# Patient Record
Sex: Male | Born: 2000 | Race: White | Hispanic: No | Marital: Single | Smoking: Current every day smoker
Health system: Southern US, Community
[De-identification: ages and names within clinical notes are randomized; demographics above are authoritative.]

## PROBLEM LIST (undated history)

## (undated) DIAGNOSIS — J45909 Unspecified asthma, uncomplicated: Secondary | ICD-10-CM

## (undated) DIAGNOSIS — R569 Unspecified convulsions: Secondary | ICD-10-CM

## (undated) DIAGNOSIS — E119 Type 2 diabetes mellitus without complications: Secondary | ICD-10-CM

---
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
## 2020-04-04 ENCOUNTER — Emergency Department (HOSPITAL_COMMUNITY)
Admission: EM | Admit: 2020-04-04 | Discharge: 2020-04-04 | Discharge status: Against Medical Advice | Payer: Self-pay | Attending: Emergency Medicine | Admitting: Emergency Medicine

## 2020-04-04 ENCOUNTER — Other Ambulatory Visit: Payer: Self-pay

## 2020-04-04 NOTE — ED Triage Notes (Signed)
Not in waiting area,cannot be seen outside

## 2020-04-04 NOTE — ED Triage Notes (Signed)
Patient called for triage , outside smoking

## 2020-05-20 ENCOUNTER — Emergency Department (HOSPITAL_COMMUNITY)
Admission: EM | Admit: 2020-05-20 | Discharge: 2020-05-20 | Disposition: A | Discharge status: Home / Self Care | Payer: Medicaid - Out of State | Attending: Emergency Medicine | Admitting: Emergency Medicine

## 2020-05-20 ENCOUNTER — Encounter (HOSPITAL_COMMUNITY): Payer: Self-pay

## 2020-05-20 ENCOUNTER — Other Ambulatory Visit: Payer: Self-pay

## 2020-05-20 DIAGNOSIS — E119 Type 2 diabetes mellitus without complications: Secondary | ICD-10-CM | POA: Diagnosis not present

## 2020-05-20 DIAGNOSIS — F17210 Nicotine dependence, cigarettes, uncomplicated: Secondary | ICD-10-CM | POA: Diagnosis not present

## 2020-05-20 DIAGNOSIS — J45909 Unspecified asthma, uncomplicated: Secondary | ICD-10-CM | POA: Diagnosis not present

## 2020-05-20 DIAGNOSIS — H6122 Impacted cerumen, left ear: Secondary | ICD-10-CM | POA: Diagnosis present

## 2020-05-20 HISTORY — DX: Unspecified asthma, uncomplicated: J45.909

## 2020-05-20 HISTORY — DX: Type 2 diabetes mellitus without complications: E11.9

## 2020-05-20 HISTORY — DX: Unspecified convulsions: R56.9

## 2020-05-20 MED ORDER — CARBAMIDE PEROXIDE 6.5 % OT SOLN
10.0000 [drp] | Freq: Once | OTIC | Status: AC
  Administered 2020-05-20: 10 [drp] via OTIC
  Filled 2020-05-20: qty 15

## 2020-05-20 NOTE — ED Provider Notes (Signed)
[facility] EMERGENCY DEPARTMENT Provider Note   CSN: [id number] Arrival date & time: 05/20/20  1116     History Chief Complaint  Patient presents with  . Cerumen Impaction    Caleb Bray is a 20 y.o. male.  HPI      Caleb Bray is a 20 y.o. male with past medical history of asthma, diabetes and seizures who presents to the Emergency Department requesting cerumen removal from the left ear canal.  States he has had wax buildup of the left ear for several days.  He describes a pressure sensation to his left ear.  Tried to remove the wax himself using a Q-tip.  States he is recently moved to the area and was unsure where to go for medical care.  Does not currently have a PCP.  He denies headache, nausea, vomiting, dizziness or visual change.  No recent illness, fever or chills.  Nothing makes his symptoms better or worse.    Past Medical History:  Diagnosis Date  . Asthma   . Diabetes mellitus without complication (HCC)   . Seizures (HCC)     There are no problems to display for this patient.   History reviewed. No pertinent surgical history.     No family history on file.  Social History   Tobacco Use  . Smoking status: Current Every Day Smoker    Packs/day: 0.50    Years: 10.00    Pack years: 5.00    Types: Cigarettes  . Smokeless tobacco: Never Used  Substance Use Topics  . Alcohol use: Yes    Alcohol/week: 5.0 standard drinks    Types: 5 Cans of beer per week    Comment: occasionally  . Drug use: Not Currently    Types: Cocaine    Comment: occassional    Home Medications Prior to Admission medications   Not on File    Allergies    Patient has no allergy information on record.  Review of Systems   Review of Systems  Constitutional: Negative for activity change, appetite change, chills, fatigue and fever.  HENT: Positive for ear pain. Negative for congestion, ear discharge, sore throat and trouble swallowing.   Eyes: Negative for visual  disturbance.  Respiratory: Negative for shortness of breath and wheezing.   Cardiovascular: Negative for chest pain.  Gastrointestinal: Negative for nausea and vomiting.  Musculoskeletal: Negative for myalgias, neck pain and neck stiffness.  Skin: Negative for rash.  Neurological: Negative for dizziness, weakness, numbness and headaches.  Hematological: Does not bruise/bleed easily.    Physical Exam Updated Vital Signs BP 113/82   Pulse 70   Temp 98.8 F (37.1 C) (Oral)   Resp 16   Ht 5\' 9"  (1.753 m)   Wt 86.2 kg   SpO2 97%   BMI 28.06 kg/m   Physical Exam Vitals and nursing note reviewed.  Constitutional:      General: He is not in acute distress.    Appearance: Normal appearance.  HENT:     Head: Atraumatic.     Right Ear: External ear normal.     Left Ear: External ear normal. There is impacted cerumen.     Ears:     Comments: Cerumen to the left ear canal.  TM not visualized due to cerumen impaction.  Abrasion noted of the left ear canal.  No active bleeding.  No edema.  Cerumen also present in the right ear canal, but TM visualized and appears normal    Mouth/Throat:  Mouth: Mucous membranes are moist.  Eyes:     Conjunctiva/sclera: Conjunctivae normal.  Cardiovascular:     Rate and Rhythm: Normal rate and regular rhythm.     Pulses: Normal pulses.  Pulmonary:     Effort: Pulmonary effort is normal.     Breath sounds: Normal breath sounds.  Musculoskeletal:        General: Normal range of motion.     Cervical back: Normal range of motion.  Skin:    General: Skin is warm.  Neurological:     General: No focal deficit present.     Mental Status: He is alert.     Sensory: No sensory deficit.     Motor: No weakness.     ED Results / Procedures / Treatments   Labs (all labs ordered are listed, but only abnormal results are displayed) Labs Reviewed - No data to display  EKG None  Radiology No results found.  Procedures Procedures   Medications  Ordered in ED Medications  carbamide peroxide (DEBROX) 6.5 % OTIC (EAR) solution 10 drop (has no administration in time range)    ED Course  I have reviewed the triage vital signs and the nursing notes.  Pertinent labs & imaging results that were available during my care of the patient were reviewed by me and considered in my medical decision making (see chart for details).    MDM Rules/Calculators/A&P                          Patient here for requesting cerumen removal from the left ear canal.  No associated symptoms.  On exam, unable to visualize left TM due to cerumen impaction.  Will apply Debrox and irrigate ear canal.   Irrigation performed by nursing staff On recheck, hearing improved.  Large amount of dark cerumen irrigated from the left ear canal.  TM now visualized and appears intact.  Abrasion to ear canal with a small amount of blood present.  No active bleeding.  Patient appears appropriate for discharge home.  Advised to avoid using Q-tips in his ears.  Final Clinical Impression(s) / ED Diagnoses Final diagnoses:  Impacted cerumen of left ear    Rx / DC Orders ED Discharge Orders    None       Pauline Aus, PA-C 05/20/20 1413    Jacalyn Lefevre, MD 05/20/20 1520

## 2020-05-20 NOTE — Discharge Instructions (Addendum)
Avoid using any Q-tips or other medications to your left ear for at least 1 week.  You may use the Debrox solution 3 to 5 drops to each ear every 2 to 3 weeks as needed for earwax buildup.  I have provided follow-up information for a local provider for you to establish care with

## 2020-05-20 NOTE — ED Triage Notes (Signed)
Pt presented to ED with excessive ear wax to left ear.

## 2020-11-15 ENCOUNTER — Emergency Department (HOSPITAL_COMMUNITY)
Admission: EM | Admit: 2020-11-15 | Discharge: 2020-11-15 | Disposition: A | Discharge status: Home / Self Care | Payer: Medicaid - Out of State | Attending: Emergency Medicine | Admitting: Emergency Medicine

## 2020-11-15 ENCOUNTER — Other Ambulatory Visit: Payer: Self-pay

## 2020-11-15 DIAGNOSIS — F17210 Nicotine dependence, cigarettes, uncomplicated: Secondary | ICD-10-CM | POA: Insufficient documentation

## 2020-11-15 DIAGNOSIS — R112 Nausea with vomiting, unspecified: Secondary | ICD-10-CM

## 2020-11-15 DIAGNOSIS — Z20822 Contact with and (suspected) exposure to covid-19: Secondary | ICD-10-CM | POA: Insufficient documentation

## 2020-11-15 DIAGNOSIS — R197 Diarrhea, unspecified: Secondary | ICD-10-CM

## 2020-11-15 DIAGNOSIS — E119 Type 2 diabetes mellitus without complications: Secondary | ICD-10-CM | POA: Insufficient documentation

## 2020-11-15 DIAGNOSIS — B349 Viral infection, unspecified: Secondary | ICD-10-CM | POA: Diagnosis not present

## 2020-11-15 DIAGNOSIS — J45909 Unspecified asthma, uncomplicated: Secondary | ICD-10-CM | POA: Insufficient documentation

## 2020-11-15 LAB — RESP PANEL BY RT-PCR (FLU A&B, COVID) ARPGX2
Influenza A by PCR: NEGATIVE
Influenza B by PCR: NEGATIVE
SARS Coronavirus 2 by RT PCR: NEGATIVE

## 2020-11-15 MED ORDER — ONDANSETRON 4 MG PO TBDP: 4.0000 mg | ORAL_TABLET | Freq: Three times a day (TID) | ORAL | 0 refills | Status: DC | PRN

## 2020-11-15 NOTE — ED Triage Notes (Signed)
Pt states he is having symptoms of Covid and wants to get tested for it. States he has been having diarrhea, headaches, vomiting, and dizziness that started yesterday morning.

## 2020-11-15 NOTE — Discharge Instructions (Signed)
You were evaluated in the Emergency Department and after careful evaluation, we did not find any emergent condition requiring admission or further testing in the hospital.  Your exam/testing today is overall reassuring.  Symptoms likely due to a viral illness.  Use MyChart to follow-up on your COVID and flu test results.  Otherwise drink plenty of fluids, use the Zofran as needed for nausea.  Please return to the Emergency Department if you experience any worsening of your condition.   Thank you for allowing [place] to be a part of your care.

## 2020-11-15 NOTE — ED Provider Notes (Signed)
[facility]-EMERGENCY DEPT [facility] Emergency Department Provider Note MRN:  [id number]  Arrival date & time: 11/15/20     Chief Complaint   N/V/D   History of Present Illness   Caleb Bray is a 20 y.o. year-old male with a history of diabetes, asthma presenting to the ED with chief complaint of nausea vomiting diarrhea.  1 to 2 days of symptoms, starting with diarrhea and progressing to nausea and 3 episodes of nonbloody nonbilious emesis this evening.  Symptoms mild to moderate, feels general malaise.  Feeling lightheaded sometimes.  Denies headache or vision change, no neck or back pain, no chest pain or shortness of breath, no abdominal pain, no numbness or weakness to the arms or legs.  Wants to be tested for COVID.   Review of Systems  A complete 10 system review of systems was obtained and all systems are negative except as noted in the HPI and PMH.   Patient's Health History    Past Medical History:  Diagnosis Date   Asthma    Diabetes mellitus without complication (HCC)    Seizures (HCC)     No past surgical history on file.  No family history on file.  Social History   Socioeconomic History   Marital status: Single    Spouse name: Not on file   Number of children: Not on file   Years of education: Not on file   Highest education level: Not on file  Occupational History   Not on file  Tobacco Use   Smoking status: Every Day    Packs/day: 0.50    Years: 10.00    Pack years: 5.00    Types: Cigarettes   Smokeless tobacco: Never  Substance and Sexual Activity   Alcohol use: Yes    Alcohol/week: 5.0 standard drinks    Types: 5 Cans of beer per week    Comment: occasionally   Drug use: Not Currently    Types: Cocaine    Comment: occassional   Sexual activity: Yes  Other Topics Concern   Not on file  Social History Narrative   Not on file   Social Determinants of Health   Financial Resource Strain: Not on file  Food Insecurity: Not on file   Transportation Needs: Not on file  Physical Activity: Not on file  Stress: Not on file  Social Connections: Not on file  Intimate Partner Violence: Not on file     Physical Exam   Vitals:   11/15/20 0510  BP: 129/71  Pulse: 72  Resp: 18  Temp: 97.7 F (36.5 C)  SpO2: 97%    CONSTITUTIONAL: Well-appearing, NAD NEURO:  Alert and oriented x 3, no focal deficits EYES:  eyes equal and reactive ENT/NECK:  no LAD, no JVD CARDIO: Regular rate, well-perfused, normal S1 and S2 PULM:  CTAB no wheezing or rhonchi GI/GU:  normal bowel sounds, non-distended, non-tender MSK/SPINE:  No gross deformities, no edema SKIN:  no rash, atraumatic PSYCH:  Appropriate speech and behavior  *Additional and/or pertinent findings included in MDM below  Diagnostic and Interventional Summary    EKG Interpretation  Date/Time:    Ventricular Rate:    PR Interval:    QRS Duration:   QT Interval:    QTC Calculation:   R Axis:     Text Interpretation:         Labs Reviewed  RESP PANEL BY RT-PCR (FLU A&B, COVID) [id number]    No orders to display    Medications -  No data to display   Procedures  /  Critical Care Procedures  ED Course and Medical Decision Making  I have reviewed the triage vital signs, the nursing notes, and pertinent available records from the EMR.  Listed above are laboratory and imaging tests that I personally ordered, reviewed, and interpreted and then considered in my medical decision making (see below for details).  Suspect viral illness, patient is with normal vital signs, well-appearing, completely benign abdominal exam with no tenderness, no rebound guarding or rigidity.  Doubt emergent process, appropriate for discharge with reassurance.       Elmer Sow. Pilar Plate, MD [facility] Emergency Medicine [facility] [email or web address]  Final Clinical Impressions(s) / ED Diagnoses     ICD-10-CM   1. Viral illness  B34.9     2. Nausea vomiting and  diarrhea  R11.2    R19.7       ED Discharge Orders          Ordered    ondansetron (ZOFRAN ODT) 4 MG disintegrating tablet  Every 8 hours PRN        11/15/20 0520             Discharge Instructions Discussed with and Provided to Patient:    Discharge Instructions      You were evaluated in the Emergency Department and after careful evaluation, we did not find any emergent condition requiring admission or further testing in the hospital.  Your exam/testing today is overall reassuring.  Symptoms likely due to a viral illness.  Use MyChart to follow-up on your COVID and flu test results.  Otherwise drink plenty of fluids, use the Zofran as needed for nausea.  Please return to the Emergency Department if you experience any worsening of your condition.   Thank you for allowing [place] to be a part of your care.       Sabas Sous, MD 11/15/20 [phone]

## 2021-03-06 ENCOUNTER — Encounter (HOSPITAL_COMMUNITY): Payer: Self-pay

## 2021-03-06 ENCOUNTER — Other Ambulatory Visit: Payer: Self-pay

## 2021-03-06 ENCOUNTER — Emergency Department (HOSPITAL_COMMUNITY): Payer: Medicaid - Out of State

## 2021-03-06 ENCOUNTER — Emergency Department (HOSPITAL_COMMUNITY)
Admission: EM | Admit: 2021-03-06 | Discharge: 2021-03-06 | Disposition: A | Discharge status: Home / Self Care | Payer: Medicaid - Out of State | Attending: Emergency Medicine | Admitting: Emergency Medicine

## 2021-03-06 DIAGNOSIS — S40011A Contusion of right shoulder, initial encounter: Secondary | ICD-10-CM | POA: Diagnosis not present

## 2021-03-06 DIAGNOSIS — Y92410 Unspecified street and highway as the place of occurrence of the external cause: Secondary | ICD-10-CM | POA: Insufficient documentation

## 2021-03-06 DIAGNOSIS — S161XXA Strain of muscle, fascia and tendon at neck level, initial encounter: Secondary | ICD-10-CM | POA: Diagnosis not present

## 2021-03-06 DIAGNOSIS — S0083XA Contusion of other part of head, initial encounter: Secondary | ICD-10-CM | POA: Diagnosis not present

## 2021-03-06 DIAGNOSIS — S20211A Contusion of right front wall of thorax, initial encounter: Secondary | ICD-10-CM | POA: Diagnosis not present

## 2021-03-06 DIAGNOSIS — S0093XA Contusion of unspecified part of head, initial encounter: Secondary | ICD-10-CM

## 2021-03-06 DIAGNOSIS — S199XXA Unspecified injury of neck, initial encounter: Secondary | ICD-10-CM | POA: Diagnosis present

## 2021-03-06 MED ORDER — IBUPROFEN 800 MG PO TABS
800.0000 mg | ORAL_TABLET | Freq: Once | ORAL | Status: AC
  Administered 2021-03-06: 800 mg via ORAL
  Filled 2021-03-06: qty 1

## 2021-03-06 MED ORDER — IBUPROFEN 800 MG PO TABS: 800.0000 mg | ORAL_TABLET | Freq: Three times a day (TID) | ORAL | 0 refills | Status: DC | PRN
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
## 2021-03-06 NOTE — ED Notes (Signed)
Patient transported to X-ray 

## 2021-03-06 NOTE — ED Notes (Signed)
Pt returned from xray

## 2021-03-06 NOTE — ED Notes (Signed)
C collar placed at this time.

## 2021-03-06 NOTE — Discharge Instructions (Addendum)
Return if any problems.

## 2021-03-06 NOTE — ED Notes (Signed)
Pt returned from CT °

## 2021-03-06 NOTE — ED Notes (Signed)
Patient transported to CT 
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
## 2021-05-27 ENCOUNTER — Encounter (HOSPITAL_COMMUNITY): Payer: Self-pay

## 2021-05-27 ENCOUNTER — Other Ambulatory Visit: Payer: Self-pay

## 2021-05-27 ENCOUNTER — Emergency Department (HOSPITAL_COMMUNITY)
Admission: EM | Admit: 2021-05-27 | Discharge: 2021-05-27 | Disposition: A | Discharge status: Home / Self Care | Payer: Medicaid - Out of State | Attending: Emergency Medicine | Admitting: Emergency Medicine

## 2021-05-27 DIAGNOSIS — K122 Cellulitis and abscess of mouth: Secondary | ICD-10-CM | POA: Insufficient documentation

## 2021-05-27 DIAGNOSIS — J029 Acute pharyngitis, unspecified: Secondary | ICD-10-CM | POA: Diagnosis present

## 2021-05-27 LAB — GROUP A STREP BY PCR: Group A Strep by PCR: NOT DETECTED

## 2021-05-27 MED ORDER — DEXAMETHASONE SODIUM PHOSPHATE 10 MG/ML IJ SOLN
8.0000 mg | Freq: Once | INTRAMUSCULAR | Status: AC
  Administered 2021-05-27: 8 mg via INTRAMUSCULAR
  Filled 2021-05-27: qty 1

## 2021-05-27 MED ORDER — IBUPROFEN 400 MG PO TABS
600.0000 mg | ORAL_TABLET | Freq: Once | ORAL | Status: AC
  Administered 2021-05-27: 600 mg via ORAL
  Filled 2021-05-27: qty 2

## 2021-05-27 NOTE — Discharge Instructions (Addendum)
We gave you some steroids in the ER and recommended ibuprofen for your condition. ?

## 2021-05-27 NOTE — ED Triage Notes (Signed)
Pt presents to ED with complaints of sore throat and swelling since last night, denies fever. Pt denies SOB ?

## 2021-05-27 NOTE — ED Provider Notes (Signed)
?[facility] EMERGENCY DEPARTMENT ?Provider Note ? ? ?CSN: [id number] ?Arrival date & time: 05/27/21  0740 ? ?  ? ?History ? ?Chief Complaint  ?Patient presents with  ? Sore Throat  ? ? ?Caleb Bray is a 21 y.o. male present emergency department with sore throat.  The patient reports onset of symptoms approximately yesterday morning, woke up with this.  He was concerned he may be having allergic reaction as he was started on new medications 5 days ago with citalopram and trazodone, but reports he has not had any reactions in the past 4 days.  He also reports pain with swallowing.  He denies fevers or chills. ? ?HPI ? ?  ? ?Home Medications ?Prior to Admission medications   ?Medication Sig Start Date End Date Taking? Authorizing Provider  ?citalopram (CELEXA) 20 MG tablet Take 20 mg by mouth daily. 04/11/20   [provider]  ?ibuprofen (ADVIL) 800 MG tablet Take 1 tablet (800 mg total) by mouth every 8 (eight) hours as needed. 03/06/21   Elson Areas, PA-C  ?ondansetron (ZOFRAN ODT) 4 MG disintegrating tablet Take 1 tablet (4 mg total) by mouth every 8 (eight) hours as needed for nausea or vomiting. 11/15/20   Sabas Sous, MD  ?risperiDONE (RISPERDAL) 1 MG tablet Take 1 mg by mouth at bedtime. 04/11/20   [provider]  ?traZODone (DESYREL) 100 MG tablet Take 100 mg by mouth at bedtime. 04/11/20   [provider]  ?   ? ?Allergies    ?Trazodone and nefazodone   ? ?Review of Systems   ?Review of Systems ? ?Physical Exam ?Updated Vital Signs ?BP 121/90 (BP Location: Left Arm)   Pulse 66   Temp 97.9 ?F (36.6 ?C) (Oral)   Resp 18   Ht 5\' 9"  (1.753 m)   Wt 99.7 kg   SpO2 99%   BMI 32.47 kg/m?  ?Physical Exam ?Constitutional:   ?   General: He is not in acute distress. ?HENT:  ?   Head: Normocephalic and atraumatic.  ?   Mouth/Throat:  ?   Pharynx: Uvula midline. Uvula swelling present.  ?   Tonsils: No tonsillar exudate or tonsillar abscesses.  ?Eyes:  ?   Conjunctiva/sclera:  Conjunctivae normal.  ?   Pupils: Pupils are equal, round, and reactive to light.  ?Cardiovascular:  ?   Rate and Rhythm: Normal rate and regular rhythm.  ?Pulmonary:  ?   Effort: Pulmonary effort is normal. No respiratory distress.  ?Abdominal:  ?   General: There is no distension.  ?   Tenderness: There is no abdominal tenderness.  ?Skin: ?   General: Skin is warm and dry.  ?Neurological:  ?   General: No focal deficit present.  ?   Mental Status: He is alert. Mental status is at baseline.  ?Psychiatric:     ?   Mood and Affect: Mood normal.     ?   Behavior: Behavior normal.  ? ? ?ED Results / Procedures / Treatments   ?Labs ?(all labs ordered are listed, but only abnormal results are displayed) ?Labs Reviewed  ?GROUP A STREP BY PCR  ? ? ?EKG ?None ? ?Radiology ?No results found. ? ?Procedures ?Procedures  ? ? ?Medications Ordered in ED ?Medications  ?ibuprofen (ADVIL) tablet 600 mg (600 mg Oral Given 05/27/21 0953)  ?dexamethasone (DECADRON) injection 8 mg (8 mg Intramuscular Given 05/27/21 0953)  ? ? ?ED Course/ Medical Decision Making/ A&P ?  ?                        ?  Medical Decision Making ?Risk ?Prescription drug management. ? ? ?Patient is here with sore throat that began this morning.  Clinical exam is consistent with uvulitis.  I do not see evidence of peritonsillar abscess, doubt Ludwig's angina or deep space infection.  I do not believe need emergent CT scan of the neck or soft tissues at this time.  I suspect is likely related to a viral illness.  We can treat with anti-inflammatories with intramuscular Decadron as well as ibuprofen at home.  I have very low suspicion for acute allergic reaction given that he has no other systemic symptoms or signs, and that he has been taking these new medications for 4 days with no adverse reaction.  I believe he is reasonably safe and stable for discharge after medications ? ? ? ? ? ? ? ?Final Clinical Impression(s) / ED Diagnoses ?Final diagnoses:  ?Uvulitis   ? ? ?Rx / DC Orders ?ED Discharge Orders   ? ? None  ? ?  ? ? ?  ?Terald Sleeper, MD ?05/27/21 [phone] ? ?

## 2021-06-10 ENCOUNTER — Emergency Department (HOSPITAL_COMMUNITY)
Admission: EM | Admit: 2021-06-10 | Discharge: 2021-06-10 | Discharge status: Against Medical Advice | Payer: Medicaid - Out of State | Attending: Emergency Medicine | Admitting: Emergency Medicine

## 2021-06-10 ENCOUNTER — Encounter (HOSPITAL_COMMUNITY): Payer: Self-pay | Admitting: *Deleted

## 2021-06-10 ENCOUNTER — Other Ambulatory Visit: Payer: Self-pay

## 2021-06-10 DIAGNOSIS — Z5321 Procedure and treatment not carried out due to patient leaving prior to being seen by health care provider: Secondary | ICD-10-CM | POA: Insufficient documentation

## 2021-06-10 DIAGNOSIS — R509 Fever, unspecified: Secondary | ICD-10-CM | POA: Diagnosis present

## 2021-06-10 NOTE — ED Triage Notes (Signed)
Pt c/o fever with sweating and state he "blacked out" an hour ago

## 2021-06-10 NOTE — ED Notes (Signed)
RN attempted to call pt with no reply

## 2021-06-16 ENCOUNTER — Other Ambulatory Visit: Payer: Self-pay

## 2021-06-16 ENCOUNTER — Emergency Department (HOSPITAL_COMMUNITY)
Admission: EM | Admit: 2021-06-16 | Discharge: 2021-06-16 | Disposition: A | Discharge status: Home / Self Care | Payer: Medicaid - Out of State | Attending: Emergency Medicine | Admitting: Emergency Medicine

## 2021-06-16 ENCOUNTER — Encounter (HOSPITAL_COMMUNITY): Payer: Self-pay

## 2021-06-16 DIAGNOSIS — H6123 Impacted cerumen, bilateral: Secondary | ICD-10-CM | POA: Diagnosis not present

## 2021-06-16 DIAGNOSIS — R42 Dizziness and giddiness: Secondary | ICD-10-CM | POA: Insufficient documentation

## 2021-06-16 DIAGNOSIS — H9201 Otalgia, right ear: Secondary | ICD-10-CM | POA: Diagnosis present

## 2021-06-16 MED ORDER — CARBAMIDE PEROXIDE 6.5 % OT SOLN: 10.0000 [drp] | Freq: Once | OTIC | Status: DC

## 2021-06-16 MED ORDER — DOCUSATE SODIUM 50 MG/5ML PO LIQD
0.5000 mL | Freq: Once | ORAL | Status: AC
  Administered 2021-06-16: 5 mg via OTIC
  Filled 2021-06-16: qty 10

## 2021-06-16 NOTE — Discharge Instructions (Signed)
Both ears have been irrigated for wax buildup.  Your right ear canal is now cleared.  You may apply 2 to 3 drops of hydrogen peroxide every couple of weeks to help prevent wax buildup.  Return to the emergency department for any new or worsening symptoms.

## 2021-06-16 NOTE — ED Notes (Signed)
Bilateral ear irrigation completed with cerumen plug removed. TM red bilat. No blood noted in ear canal.

## 2021-06-16 NOTE — ED Triage Notes (Signed)
Patient with complaints of right ear pain that started the previous day.

## 2021-06-16 NOTE — ED Provider Notes (Signed)
[facility] EMERGENCY DEPARTMENT Provider Note   CSN: [id number] Arrival date & time: 06/16/21  3220     History  Chief Complaint  Patient presents with   Otalgia    Caleb Bray is a 21 y.o. male.   Otalgia Associated symptoms: hearing loss   Associated symptoms: no abdominal pain, no congestion, no ear discharge, no fever, no headaches, no sore throat and no vomiting         Caleb Bray is a 21 y.o. male who presents to the Emergency Department complaining of decreased hearing of both ears and pain of the right ear.  He notes decreased hearing for several days and pain to his right ear beginning yesterday.  He states that he has recurrent problems with wax buildup to his ears and has required irrigation multiple times.  He is requesting to have his ears flushed.  He also notes having some mild dizziness since yesterday.  He denies any fever, sore throat, chills, cough nausea or vomiting.  No headache or visual changes.   Home Medications Prior to Admission medications   Medication Sig Start Date End Date Taking? Authorizing Provider  citalopram (CELEXA) 20 MG tablet Take 20 mg by mouth daily. 04/11/20   [provider]  ibuprofen (ADVIL) 800 MG tablet Take 1 tablet (800 mg total) by mouth every 8 (eight) hours as needed. 03/06/21   Elson Areas, PA-C  ondansetron (ZOFRAN ODT) 4 MG disintegrating tablet Take 1 tablet (4 mg total) by mouth every 8 (eight) hours as needed for nausea or vomiting. 11/15/20   Sabas Sous, MD  risperiDONE (RISPERDAL) 1 MG tablet Take 1 mg by mouth at bedtime. 04/11/20   [provider]  traZODone (DESYREL) 100 MG tablet Take 100 mg by mouth at bedtime. 04/11/20   [provider]      Allergies    Trazodone and nefazodone    Review of Systems   Review of Systems  Constitutional:  Negative for chills and fever.  HENT:  Positive for ear pain and hearing loss. Negative for congestion, ear discharge, facial swelling  and sore throat.   Respiratory:  Negative for shortness of breath.   Cardiovascular:  Negative for chest pain.  Gastrointestinal:  Negative for abdominal pain, nausea and vomiting.  Neurological:  Positive for dizziness. Negative for syncope and headaches.   Physical Exam Updated Vital Signs BP (!) 133/97 (BP Location: Right Arm)   Pulse 70   Temp 98 F (36.7 C) (Oral)   Resp 20   Ht 5\' 9"  (1.753 m)   Wt 99.8 kg   SpO2 98%   BMI 32.49 kg/m  Physical Exam Vitals and nursing note reviewed.  Constitutional:      Appearance: Normal appearance. He is not ill-appearing or toxic-appearing.  HENT:     Head: Atraumatic.     Ears:     Comments: Cerumen noted to bilateral ear canals.  Unable to visualize TMs.  No drainage or edema.  External ear nontender.  No mastoid tenderness.    Nose: Nose normal.     Mouth/Throat:     Mouth: Mucous membranes are moist.     Pharynx: Oropharynx is clear.  Cardiovascular:     Rate and Rhythm: Normal rate and regular rhythm.     Pulses: Normal pulses.  Pulmonary:     Effort: Pulmonary effort is normal.  Musculoskeletal:        General: Normal range of motion.  Cervical back: Normal range of motion.  Lymphadenopathy:     Cervical: No cervical adenopathy.  Skin:    General: Skin is warm.  Neurological:     General: No focal deficit present.     Mental Status: He is alert.     Sensory: No sensory deficit.     Motor: No weakness.    ED Results / Procedures / Treatments   Labs (all labs ordered are listed, but only abnormal results are displayed) Labs Reviewed - No data to display  EKG None  Radiology No results found.  Procedures Procedures    Medications Ordered in ED Medications  docusate (COLACE) 50 MG/5ML liquid 5 mg (has no administration in time range)    ED Course/ Medical Decision Making/ A&P                           Medical Decision Making Patient here for evaluation of right ear pain, decreased hearing and  recurrent cerumen impaction.  He notes decreased hearing for several days and pain of his right ear since yesterday.  He had also notes having some mild dizziness associated with movement.  No fever or chills.  No neck pain, mastoid tenderness or headache.  On exam, patient well-appearing.  Vital signs reassuring.  He has cerumen impaction bilaterally.  There is no discharge from the ear canal.  I cannot visualize TMs at this time.  Plan will include cerumen irrigation  Risk OTC drugs.   On recheck, docusate applied in both ears irrigated with saline.  Right ear now cleared of cerumen and TM visualized without evidence of injury.  Cerumen still remains left ear canal, offered repeat irrigation but patient declines stating that his left ear feels fine and he can hear without difficulty now.  Appears stable for discharge he will follow-up with PCP        Final Clinical Impression(s) / ED Diagnoses Final diagnoses:  Bilateral impacted cerumen    Rx / DC Orders ED Discharge Orders     None         Rosey Bath 06/18/21 7680    Gerhard Munch, MD 06/18/21 1755

## 2021-06-16 NOTE — ED Notes (Signed)
Pt states he is walking home. Pt instructed not to drive or operate heavy machinery within one hour after ear irrigation.   Pt provided discharge instructions and prescription information. Pt was given the opportunity to ask questions and questions were answered.

## 2021-10-12 ENCOUNTER — Emergency Department (HOSPITAL_COMMUNITY)
Admission: EM | Admit: 2021-10-12 | Discharge: 2021-10-12 | Disposition: A | Discharge status: Home / Self Care | Payer: Medicaid - Out of State | Attending: Emergency Medicine | Admitting: Emergency Medicine

## 2021-10-12 ENCOUNTER — Encounter (HOSPITAL_COMMUNITY): Payer: Self-pay | Admitting: Emergency Medicine

## 2021-10-12 ENCOUNTER — Other Ambulatory Visit: Payer: Self-pay

## 2021-10-12 DIAGNOSIS — H6502 Acute serous otitis media, left ear: Secondary | ICD-10-CM | POA: Insufficient documentation

## 2021-10-12 DIAGNOSIS — H9202 Otalgia, left ear: Secondary | ICD-10-CM | POA: Diagnosis present

## 2021-10-12 MED ORDER — CLARITIN-D 12 HOUR 5-120 MG PO TB12: 1.0000 | ORAL_TABLET | Freq: Two times a day (BID) | ORAL | 0 refills | Status: AC

## 2021-10-12 MED ORDER — AMOXICILLIN 500 MG PO CAPS: 500.0000 mg | ORAL_CAPSULE | Freq: Three times a day (TID) | ORAL | 0 refills | Status: AC

## 2021-10-12 NOTE — Discharge Instructions (Signed)
You are being treated for a suspected early left ear infection.  Take the entire course of the antibiotics.  Also recommend trying the Claritin-D to help you with the nasal and sinus congestion which I think is playing a big role in your ear pain.

## 2021-10-12 NOTE — ED Triage Notes (Signed)
Pt to the ED with complaints of wax build up in his ears that is impeding his hearing.

## 2021-10-17 NOTE — ED Provider Notes (Signed)
[facility] EMERGENCY DEPARTMENT Provider Note   CSN: [id number] Arrival date & time: 10/12/21  2353     History  Chief Complaint  Patient presents with   Cerumen Impaction    Caleb Bray is a 21 y.o. male presenting for evaluation of decreased hearing acuity and occasional fluid sensation in his ears.  He reports having recent cerumen impactions with similar symptoms and is concerned this may have come back.  He also endorses nasal congestion without rhinorrhea, denies sinus pain, fevers or chills.  There is been no drainage from his ears and he denies any significant ear pain.  He has had no treatment prior to arrival.  The history is provided by the patient.       Home Medications Prior to Admission medications   Medication Sig Start Date End Date Taking? Authorizing Provider  amoxicillin (AMOXIL) 500 MG capsule Take 1 capsule (500 mg total) by mouth 3 (three) times daily. 10/12/21  Yes Korin Setzler, Almyra Free, PA-C  loratadine-pseudoephedrine (CLARITIN-D 12 HOUR) 5-120 MG tablet Take 1 tablet by mouth 2 (two) times daily. 10/12/21  Yes Alyas Creary, Almyra Free, PA-C  citalopram (CELEXA) 20 MG tablet Take 20 mg by mouth daily. 04/11/20   [provider]  ibuprofen (ADVIL) 800 MG tablet Take 1 tablet (800 mg total) by mouth every 8 (eight) hours as needed. 03/06/21   Fransico Meadow, PA-C  risperiDONE (RISPERDAL) 1 MG tablet Take 1 mg by mouth daily. 04/11/20   [provider]  traZODone (DESYREL) 100 MG tablet Take 100 mg by mouth at bedtime. 04/11/20   [provider]      Allergies    Trazodone and nefazodone    Review of Systems   Review of Systems  Constitutional:  Negative for chills and fever.  HENT:  Positive for congestion and hearing loss. Negative for ear pain, rhinorrhea, sinus pressure, sore throat, trouble swallowing and voice change.   Eyes:  Negative for discharge.  Respiratory:  Negative for cough, shortness of breath, wheezing and stridor.   Cardiovascular:   Negative for chest pain.  Genitourinary: Negative.     Physical Exam Updated Vital Signs BP (!) 151/93 (BP Location: Right Arm)   Pulse 74   Temp 98.4 F (36.9 C) (Oral)   Resp 16   Ht 5\' 9"  (1.753 m)   Wt 99.8 kg   SpO2 99%   BMI 32.49 kg/m  Physical Exam Constitutional:      Appearance: He is well-developed.  HENT:     Head: Normocephalic and atraumatic.     Right Ear: Tympanic membrane, ear canal and external ear normal. There is no impacted cerumen.     Left Ear: Ear canal and external ear normal. A middle ear effusion is present. There is no impacted cerumen. Tympanic membrane is bulging.     Nose: Mucosal edema present. No rhinorrhea.     Mouth/Throat:     Mouth: Mucous membranes are moist.     Pharynx: Oropharynx is clear. Uvula midline. No oropharyngeal exudate or posterior oropharyngeal erythema.     Tonsils: No tonsillar abscesses.  Eyes:     Conjunctiva/sclera: Conjunctivae normal.  Cardiovascular:     Rate and Rhythm: Normal rate.     Heart sounds: Normal heart sounds.  Pulmonary:     Effort: Pulmonary effort is normal. No respiratory distress.     Breath sounds: No wheezing or rales.  Musculoskeletal:        General: Normal range of motion.  Skin:  General: Skin is warm and dry.     Findings: No rash.  Neurological:     Mental Status: He is alert and oriented to person, place, and time.     ED Results / Procedures / Treatments   Labs (all labs ordered are listed, but only abnormal results are displayed) Labs Reviewed - No data to display  EKG None  Radiology No results found.  Procedures Procedures    Medications Ordered in ED Medications - No data to display  ED Course/ Medical Decision Making/ A&P                           Medical Decision Making Patient presenting with decreased hearing acuity in association with nasal/sinus congestion, no fevers or chills, no drainage.  His exam suggest a possible early left acute otitis media  with loss of TM landmarks and a trace of effusion present.  He has no cerumen impaction.  He is placed on antibiotics, we discussed other home treatment, specifically recommended an antihistamine decongestant combination.  As needed follow-up anticipated.  Risk OTC drugs. Prescription drug management.           Final Clinical Impression(s) / ED Diagnoses Final diagnoses:  Non-recurrent acute serous otitis media of left ear    Rx / DC Orders ED Discharge Orders          Ordered    amoxicillin (AMOXIL) 500 MG capsule  3 times daily        10/12/21 1251    loratadine-pseudoephedrine (CLARITIN-D 12 HOUR) 5-120 MG tablet  2 times daily        10/12/21 1251              Burgess Amor, Cordelia Poche 10/17/21 2131    Bethann Berkshire, MD 10/19/21 1037

## 2022-03-29 ENCOUNTER — Encounter (HOSPITAL_COMMUNITY): Payer: Self-pay

## 2022-03-29 ENCOUNTER — Emergency Department (HOSPITAL_COMMUNITY): Payer: Medicaid Other

## 2022-03-29 ENCOUNTER — Emergency Department (HOSPITAL_COMMUNITY)
Admission: EM | Admit: 2022-03-29 | Discharge: 2022-03-29 | Disposition: A | Discharge status: Home / Self Care | Payer: Medicaid Other | Attending: Emergency Medicine | Admitting: Emergency Medicine

## 2022-03-29 ENCOUNTER — Other Ambulatory Visit: Payer: Self-pay

## 2022-03-29 DIAGNOSIS — R197 Diarrhea, unspecified: Secondary | ICD-10-CM | POA: Diagnosis not present

## 2022-03-29 DIAGNOSIS — R1031 Right lower quadrant pain: Secondary | ICD-10-CM | POA: Insufficient documentation

## 2022-03-29 DIAGNOSIS — R110 Nausea: Secondary | ICD-10-CM | POA: Diagnosis not present

## 2022-03-29 LAB — COMPREHENSIVE METABOLIC PANEL
ALT: 29 U/L (ref 0–44)
AST: 23 U/L (ref 15–41)
Albumin: 4.3 g/dL (ref 3.5–5.0)
Alkaline Phosphatase: 59 U/L (ref 38–126)
Anion gap: 8 (ref 5–15)
BUN: 6 mg/dL (ref 6–20)
CO2: 25 mmol/L (ref 22–32)
Calcium: 8.8 mg/dL — ABNORMAL LOW (ref 8.9–10.3)
Chloride: 104 mmol/L (ref 98–111)
Creatinine, Ser: 0.8 mg/dL (ref 0.61–1.24)
GFR, Estimated: >60 mL/min (ref >60)
Glucose, Bld: 93 mg/dL (ref 70–99)
Potassium: 3.7 mmol/L (ref 3.5–5.1)
Sodium: 137 mmol/L (ref 135–145)
Total Bilirubin: 0.6 mg/dL (ref 0.3–1.2)
Total Protein: 7.1 g/dL (ref 6.5–8.1)

## 2022-03-29 LAB — URINALYSIS, ROUTINE W REFLEX MICROSCOPIC
Bilirubin Urine: NEGATIVE
Glucose, UA: NEGATIVE mg/dL
Hgb urine dipstick: NEGATIVE
Ketones, ur: NEGATIVE mg/dL
Leukocytes,Ua: NEGATIVE
Nitrite: NEGATIVE
Protein, ur: NEGATIVE mg/dL
Specific Gravity, Urine: >1.046 — ABNORMAL HIGH (ref 1.005–1.030)
pH: 6 (ref 5.0–8.0)

## 2022-03-29 LAB — CBC
HCT: 44.2 % (ref 39.0–52.0)
Hemoglobin: 15.1 g/dL (ref 13.0–17.0)
MCH: 30.4 pg (ref 26.0–34.0)
MCHC: 34.2 g/dL (ref 30.0–36.0)
MCV: 88.9 fL (ref 80.0–100.0)
Platelets: 238 10*3/uL (ref 150–400)
RBC: 4.97 MIL/uL (ref 4.22–5.81)
RDW: 12.5 % (ref 11.5–15.5)
WBC: 8.9 10*3/uL (ref 4.0–10.5)
nRBC: 0 % (ref 0.0–0.2)

## 2022-03-29 LAB — LIPASE, BLOOD: Lipase: 27 U/L (ref 11–51)

## 2022-03-29 MED ORDER — ONDANSETRON HCL 4 MG PO TABS: 4.0000 mg | ORAL_TABLET | Freq: Four times a day (QID) | ORAL | 0 refills | Status: AC

## 2022-03-29 MED ORDER — IOHEXOL 300 MG/ML  SOLN
100.0000 mL | Freq: Once | INTRAMUSCULAR | Status: AC | PRN
  Administered 2022-03-29: 100 mL via INTRAVENOUS

## 2022-03-29 MED ORDER — PANTOPRAZOLE SODIUM 40 MG PO TBEC: 40.0000 mg | DELAYED_RELEASE_TABLET | Freq: Every day | ORAL | 0 refills | Status: AC

## 2022-03-29 NOTE — ED Provider Notes (Signed)
[facility] Provider Note   CSN: [id number] Arrival date & time: 03/29/22  [id number]     History  Chief Complaint  Patient presents with   Abdominal Pain    Caleb Bray is a 22 y.o. male.   Abdominal Pain Associated symptoms: diarrhea and nausea   Associated symptoms: no chest pain, no chills, no cough, no dysuria, no fever, no hematuria, no shortness of breath and no vomiting        Caleb Bray is a 22 y.o. male who presents to the Emergency Department complaining of lower abdominal pain and diarrhea that began yesterday.  States pain is constant and gradually worsening.  He is having pain of the right lower quadrant.  Describes diarrhea as loose stools that are brown in color.  Some associated nausea as well.  No melena or hematochezia.  Denies any recent illness, fever, chills, vomiting, flank or back pain.  No dysuria.  Home Medications Prior to Admission medications   Medication Sig Start Date End Date Taking? Authorizing Provider  amoxicillin (AMOXIL) 500 MG capsule Take 1 capsule (500 mg total) by mouth 3 (three) times daily. 10/12/21   Evalee Jefferson, PA-C  citalopram (CELEXA) 20 MG tablet Take 20 mg by mouth daily. 04/11/20   [provider]  ibuprofen (ADVIL) 800 MG tablet Take 1 tablet (800 mg total) by mouth every 8 (eight) hours as needed. 03/06/21   Fransico Meadow, PA-C  loratadine-pseudoephedrine (CLARITIN-D 12 HOUR) 5-120 MG tablet Take 1 tablet by mouth 2 (two) times daily. 10/12/21   Evalee Jefferson, PA-C  risperiDONE (RISPERDAL) 1 MG tablet Take 1 mg by mouth daily. 04/11/20   [provider]  traZODone (DESYREL) 100 MG tablet Take 100 mg by mouth at bedtime. 04/11/20   [provider]      Allergies    Trazodone and nefazodone    Review of Systems   Review of Systems  Constitutional:  Positive for appetite change. Negative for chills and fever.  Respiratory:  Negative for cough and shortness of  breath.   Cardiovascular:  Negative for chest pain.  Gastrointestinal:  Positive for abdominal pain, diarrhea and nausea. Negative for vomiting.  Genitourinary:  Negative for dysuria, flank pain and hematuria.  Musculoskeletal:  Negative for back pain.  Neurological:  Negative for dizziness, weakness and headaches.    Physical Exam Updated Vital Signs BP 135/68   Pulse 72   Temp 98.5 F (36.9 C) (Oral)   Resp (!) 24   Ht 5\' 9"  (1.753 m)   Wt 83.9 kg   SpO2 98%   BMI 27.32 kg/m  Physical Exam Vitals and nursing note reviewed.  Constitutional:      General: He is not in acute distress.    Appearance: Normal appearance. He is well-developed. He is not ill-appearing or toxic-appearing.  HENT:     Mouth/Throat:     Mouth: Mucous membranes are moist.  Cardiovascular:     Rate and Rhythm: Normal rate and regular rhythm.     Pulses: Normal pulses.  Pulmonary:     Effort: Pulmonary effort is normal.  Abdominal:     Palpations: Abdomen is soft.     Tenderness: There is abdominal tenderness.     Comments: Tenderness palpation right lower quadrant and mid lower abdomen.  No guarding or rebound.  Abdomen is soft.  No CVA tenderness  Musculoskeletal:        General: Normal range of motion.  Skin:    General: Skin is warm.     Capillary Refill: Capillary refill takes less than 2 seconds.  Neurological:     Mental Status: He is alert.     Sensory: No sensory deficit.     Motor: No weakness.     ED Results / Procedures / Treatments   Labs (all labs ordered are listed, but only abnormal results are displayed) Labs Reviewed  COMPREHENSIVE METABOLIC PANEL - Abnormal; Notable for the following components:      Result Value   Calcium 8.8 (*)    All other components within normal limits  URINALYSIS, ROUTINE W REFLEX MICROSCOPIC - Abnormal; Notable for the following components:   Color, Urine STRAW (*)    Specific Gravity, Urine >1.046 (*)    All other components within normal  limits  LIPASE, BLOOD  CBC    EKG None  Radiology CT ABDOMEN PELVIS W CONTRAST  Result Date: 03/29/2022 CLINICAL DATA:  Right lower quadrant abdominal pain. EXAM: CT ABDOMEN AND PELVIS WITH CONTRAST TECHNIQUE: Multidetector CT imaging of the abdomen and pelvis was performed using the standard protocol following bolus administration of intravenous contrast. RADIATION DOSE REDUCTION: This exam was performed according to the departmental dose-optimization program which includes automated exposure control, adjustment of the mA and/or kV according to patient size and/or use of iterative reconstruction technique. CONTRAST:  175mL OMNIPAQUE IOHEXOL 300 MG/ML  SOLN COMPARISON:  None Available. FINDINGS: Lower chest: The visualized lung bases are clear No intra-abdominal free air or free fluid. Hepatobiliary: No focal liver abnormality is seen. No gallstones, gallbladder wall thickening, or biliary dilatation. Pancreas: Unremarkable. No pancreatic ductal dilatation or surrounding inflammatory changes. Spleen: Normal in size without focal abnormality. Adrenals/Urinary Tract: The adrenal glands unremarkable. Kidneys, visualized ureters, and urinary bladder appear unremarkable. Stomach/Bowel: There is no bowel obstruction or active inflammation. The appendix is normal. Vascular/Lymphatic: The abdominal aorta and IVC are unremarkable. No portal venous gas. There is no adenopathy. Reproductive: The prostate and seminal vesicles are grossly unremarkable. No pelvic mass. Other: None Musculoskeletal: No acute or significant osseous findings. IMPRESSION: No acute intra-abdominal or pelvic pathology. Normal appendix. Electronically Signed   By: Anner Crete M.D.   On: 03/29/2022 19:48    Procedures Procedures    Medications Ordered in ED Medications  iohexol (OMNIPAQUE) 300 MG/ML solution 100 mL (100 mLs Intravenous Contrast Given 03/29/22 1920)    ED Course/ Medical Decision Making/ A&P                              Medical Decision Making Patient here with right lower abdominal pain with associated nausea and diarrhea.  Symptoms began 1 day prior to arrival.  He endorses watery loose stools with nausea, no fever or vomiting.  No dysuria symptoms or flank pain.  No recent sick contacts.  Denies any recent antibiotic use.  Clinically, patient is well-appearing nontoxic.  He is afebrile.  He does have right lower quadrant tenderness on my exam.  Differential would include viral process, acute appendicitis, C. difficile also considered but felt less likely given lack of recent antibiotics.  His vital signs are reassuring.  No history of prior abdominal surgeries to suggest obstruction.  Will obtain labs and CT abdomen pelvis for further evaluation of possible appendicitis.  Amount and/or Complexity of Data Reviewed Labs: ordered.    Details: Labs interpreted by me, no evidence of leukocytosis, hemoglobin unremarkable.  Lipase also unremarkable.  Chemistries without derangement and urinalysis without evidence of infection. Radiology: ordered.    Details: CT abdomen and pelvis shows normal-appearing appendix and no evidence of acute intra-abdominal or pelvic pathology. Discussion of management or test interpretation with external provider(s): On recheck, patient resting comfortably.  No vomiting or diarrhea during ER stay.  CT abdomen pelvis shows normal-appearing appendix.  I suspect this is related to gastroenteritis.  I feel that he is appropriate for discharge home, will prescribe PPI and Zofran.  He will maintain a bland diet and proper fluid intake.  Return precautions were discussed.  Risk Prescription drug management.           Final Clinical Impression(s) / ED Diagnoses Final diagnoses:  Right lower quadrant abdominal pain    Rx / DC Orders ED Discharge Orders     None         Bufford Lope 03/29/22 2101    Noemi Chapel, MD 03/30/22 [phone]

## 2022-03-29 NOTE — Discharge Instructions (Signed)
Bland diet as tolerated.  Take the pantoprazole and ondansetron as directed.  Follow-up with your primary care provider for recheck or return to the emergency department for any new or worsening symptoms.

## 2022-03-29 NOTE — ED Triage Notes (Signed)
Pt reports diarrhea yesterday and then having lower abd pain today.

## 2022-06-29 ENCOUNTER — Emergency Department (HOSPITAL_COMMUNITY)
Admission: EM | Admit: 2022-06-29 | Discharge: 2022-06-29 | Disposition: A | Discharge status: Home / Self Care | Payer: Medicaid Other | Attending: Emergency Medicine | Admitting: Emergency Medicine

## 2022-06-29 ENCOUNTER — Encounter (HOSPITAL_COMMUNITY): Payer: Self-pay | Admitting: Emergency Medicine

## 2022-06-29 ENCOUNTER — Emergency Department (HOSPITAL_COMMUNITY): Payer: Medicaid Other

## 2022-06-29 ENCOUNTER — Other Ambulatory Visit: Payer: Self-pay

## 2022-06-29 DIAGNOSIS — X500XXA Overexertion from strenuous movement or load, initial encounter: Secondary | ICD-10-CM | POA: Insufficient documentation

## 2022-06-29 DIAGNOSIS — J45909 Unspecified asthma, uncomplicated: Secondary | ICD-10-CM | POA: Diagnosis not present

## 2022-06-29 DIAGNOSIS — F32A Depression, unspecified: Secondary | ICD-10-CM | POA: Insufficient documentation

## 2022-06-29 DIAGNOSIS — E119 Type 2 diabetes mellitus without complications: Secondary | ICD-10-CM | POA: Insufficient documentation

## 2022-06-29 DIAGNOSIS — S39012A Strain of muscle, fascia and tendon of lower back, initial encounter: Secondary | ICD-10-CM | POA: Insufficient documentation

## 2022-06-29 DIAGNOSIS — Z79899 Other long term (current) drug therapy: Secondary | ICD-10-CM | POA: Diagnosis not present

## 2022-06-29 DIAGNOSIS — M545 Low back pain, unspecified: Secondary | ICD-10-CM | POA: Diagnosis present

## 2022-06-29 LAB — COMPREHENSIVE METABOLIC PANEL
ALT: 28 U/L (ref 0–44)
AST: 19 U/L (ref 15–41)
Albumin: 4.2 g/dL (ref 3.5–5.0)
Alkaline Phosphatase: 62 U/L (ref 38–126)
Anion gap: 9 (ref 5–15)
BUN: 9 mg/dL (ref 6–20)
CO2: 25 mmol/L (ref 22–32)
Calcium: 8.9 mg/dL (ref 8.9–10.3)
Chloride: 103 mmol/L (ref 98–111)
Creatinine, Ser: 0.85 mg/dL (ref 0.61–1.24)
GFR, Estimated: >60 mL/min (ref >60)
Glucose, Bld: 84 mg/dL (ref 70–99)
Potassium: 3.5 mmol/L (ref 3.5–5.1)
Sodium: 137 mmol/L (ref 135–145)
Total Bilirubin: 0.7 mg/dL (ref 0.3–1.2)
Total Protein: 7.2 g/dL (ref 6.5–8.1)

## 2022-06-29 LAB — URINALYSIS, ROUTINE W REFLEX MICROSCOPIC
Bilirubin Urine: NEGATIVE
Glucose, UA: NEGATIVE mg/dL
Hgb urine dipstick: NEGATIVE
Ketones, ur: NEGATIVE mg/dL
Leukocytes,Ua: NEGATIVE
Nitrite: NEGATIVE
Protein, ur: NEGATIVE mg/dL
Specific Gravity, Urine: 1.003 — ABNORMAL LOW (ref 1.005–1.030)
pH: 7 (ref 5.0–8.0)

## 2022-06-29 LAB — CBC WITH DIFFERENTIAL/PLATELET
Abs Immature Granulocytes: 0.03 10*3/uL (ref 0.00–0.07)
Basophils Absolute: 0.1 10*3/uL (ref 0.0–0.1)
Basophils Relative: 1 %
Eosinophils Absolute: 0.1 10*3/uL (ref 0.0–0.5)
Eosinophils Relative: 2 %
HCT: 45.5 % (ref 39.0–52.0)
Hemoglobin: 15.3 g/dL (ref 13.0–17.0)
Immature Granulocytes: 1 %
Lymphocytes Relative: 31 %
Lymphs Abs: 2 10*3/uL (ref 0.7–4.0)
MCH: 29.8 pg (ref 26.0–34.0)
MCHC: 33.6 g/dL (ref 30.0–36.0)
MCV: 88.7 fL (ref 80.0–100.0)
Monocytes Absolute: 0.7 10*3/uL (ref 0.1–1.0)
Monocytes Relative: 11 %
Neutro Abs: 3.7 10*3/uL (ref 1.7–7.7)
Neutrophils Relative %: 54 %
Platelets: 217 10*3/uL (ref 150–400)
RBC: 5.13 MIL/uL (ref 4.22–5.81)
RDW: 12.7 % (ref 11.5–15.5)
WBC: 6.7 10*3/uL (ref 4.0–10.5)
nRBC: 0 % (ref 0.0–0.2)

## 2022-06-29 LAB — SALICYLATE LEVEL: Salicylate Lvl: <7 mg/dL — ABNORMAL LOW (ref 7.0–30.0)

## 2022-06-29 LAB — RAPID URINE DRUG SCREEN, HOSP PERFORMED
Amphetamines: NOT DETECTED
Barbiturates: NOT DETECTED
Benzodiazepines: NOT DETECTED
Cocaine: POSITIVE — AB
Opiates: NOT DETECTED
Tetrahydrocannabinol: POSITIVE — AB

## 2022-06-29 LAB — ACETAMINOPHEN LEVEL: Acetaminophen (Tylenol), Serum: <10 ug/mL — ABNORMAL LOW (ref 10–30)

## 2022-06-29 MED ORDER — METHOCARBAMOL 500 MG PO TABS: 500.0000 mg | ORAL_TABLET | Freq: Three times a day (TID) | ORAL | 0 refills | Status: AC

## 2022-06-29 MED ORDER — IBUPROFEN 800 MG PO TABS: 800.0000 mg | ORAL_TABLET | Freq: Three times a day (TID) | ORAL | 0 refills | Status: AC

## 2022-06-29 MED ORDER — IBUPROFEN 800 MG PO TABS
800.0000 mg | ORAL_TABLET | Freq: Once | ORAL | Status: AC
  Administered 2022-06-29: 800 mg via ORAL
  Filled 2022-06-29: qty 1

## 2022-06-29 MED ORDER — METHOCARBAMOL 500 MG PO TABS
500.0000 mg | ORAL_TABLET | Freq: Once | ORAL | Status: AC
  Administered 2022-06-29: 500 mg via ORAL
  Filled 2022-06-29: qty 1

## 2022-06-29 NOTE — ED Provider Notes (Signed)
Caleb Bray EMERGENCY DEPARTMENT AT [facility] Provider Note   CSN: [id number] Arrival date & time: 06/29/22  1449     History  Chief Complaint  Patient presents with   Back Pain    Caleb Bray is a 22 y.o. male.   Back Pain Associated symptoms: no abdominal pain, no chest pain, no fever, no headaches, no numbness and no weakness         Caleb Bray is a 22 y.o. male with past medical history of seizures, asthma, and 2 diabetes.  Who presents to the Emergency Department complaining of left-sided low back pain after carrying a heavy mattress last evening. Carried the mattress on his back.he carried the mattress for some distance from 1 house to another.  Pain worse today.  Pain associated with movement.  Denies any radiating pain into his groin, abdomen or left leg.  Denies numbness or weakness of his lower extremities.  No urine or bowel changes.  He also requesting evaluation for depression and suicidal thought.  States he had thoughts of harming himself this morning but denies any suicidal homicidal thoughts or plans at this time, denies any visual or auditory hallucinations.  He is requesting refills for his psychotic medications.  States he has been off of these medicines for approximately 3 months.  Does not currently have a PCP.     Home Medications Prior to Admission medications   Medication Sig Start Date End Date Taking? Authorizing Provider  amoxicillin (AMOXIL) 500 MG capsule Take 1 capsule (500 mg total) by mouth 3 (three) times daily. 10/12/21   Burgess Amor, PA-C  citalopram (CELEXA) 20 MG tablet Take 20 mg by mouth daily. 04/11/20   [provider]  ibuprofen (ADVIL) 800 MG tablet Take 1 tablet (800 mg total) by mouth every 8 (eight) hours as needed. 03/06/21   Elson Areas, PA-C  loratadine-pseudoephedrine (CLARITIN-D 12 HOUR) 5-120 MG tablet Take 1 tablet by mouth 2 (two) times daily. 10/12/21   Burgess Amor, PA-C  ondansetron (ZOFRAN) 4  MG tablet Take 1 tablet (4 mg total) by mouth every 6 (six) hours. As needed for nausea vomiting 03/29/22   Agripina Guyette, PA-C  pantoprazole (PROTONIX) 40 MG tablet Take 1 tablet (40 mg total) by mouth daily. 03/29/22   Alee Gressman, PA-C  risperiDONE (RISPERDAL) 1 MG tablet Take 1 mg by mouth daily. 04/11/20   [provider]  traZODone (DESYREL) 100 MG tablet Take 100 mg by mouth at bedtime. 04/11/20   [provider]      Allergies    Trazodone and nefazodone    Review of Systems   Review of Systems  Constitutional:  Negative for appetite change and fever.  Respiratory:  Negative for shortness of breath.   Cardiovascular:  Negative for chest pain.  Gastrointestinal:  Negative for abdominal pain, nausea and vomiting.  Musculoskeletal:  Positive for back pain. Negative for neck pain and neck stiffness.  Neurological:  Negative for dizziness, weakness, numbness and headaches.  Psychiatric/Behavioral:  Positive for suicidal ideas. Negative for agitation, confusion and hallucinations.     Physical Exam Updated Vital Signs BP 135/87 (BP Location: Right Arm)   Pulse 72   Temp 98.1 F (36.7 C) (Oral)   Resp 16   Ht 5\' 9"  (1.753 m)   Wt 90.1 kg   SpO2 100%   BMI 29.33 kg/m  Physical Exam Vitals and nursing note reviewed.  Constitutional:      General: He is  not in acute distress.    Appearance: Normal appearance. He is not ill-appearing or toxic-appearing.  HENT:     Mouth/Throat:     Mouth: Mucous membranes are moist.  Eyes:     Extraocular Movements: Extraocular movements intact.     Conjunctiva/sclera: Conjunctivae normal.     Pupils: Pupils are equal, round, and reactive to light.  Cardiovascular:     Rate and Rhythm: Normal rate and regular rhythm.     Pulses: Normal pulses.  Pulmonary:     Effort: Pulmonary effort is normal.  Abdominal:     Palpations: Abdomen is soft.     Tenderness: There is no abdominal tenderness.  Musculoskeletal:         General: Tenderness present. No swelling or deformity.     Lumbar back: Tenderness present. No swelling. Positive left straight leg raise test. Negative right straight leg raise test.     Comments: Focal tenderness to palpation of the left lumbar paraspinal muscles.  No midline tenderness or bony step-offs.  Skin:    General: Skin is warm.     Capillary Refill: Capillary refill takes less than 2 seconds.  Neurological:     General: No focal deficit present.     Mental Status: He is alert.     Sensory: No sensory deficit.     Motor: No weakness.  Psychiatric:        Attention and Perception: Attention normal.        Mood and Affect: Mood is depressed.        Speech: Speech normal.        Behavior: Behavior normal. Behavior is cooperative.        Thought Content: Thought content is not paranoid. Thought content does not include homicidal ideation. Thought content does not include homicidal or suicidal plan.     Comments: Patient denies any SI or HI thought or plan at this tim, admits to having suicidal thoughts this morning.  Does not appear to be interacting with internal stimuli at this time.     ED Results / Procedures / Treatments   Labs (all labs ordered are listed, but only abnormal results are displayed) Labs Reviewed  ACETAMINOPHEN LEVEL - Abnormal; Notable for the following components:      Result Value   Acetaminophen (Tylenol), Serum <10 (*)    All other components within normal limits  SALICYLATE LEVEL - Abnormal; Notable for the following components:   Salicylate Lvl <7.0 (*)    All other components within normal limits  URINALYSIS, ROUTINE W REFLEX MICROSCOPIC - Abnormal; Notable for the following components:   Color, Urine STRAW (*)    Specific Gravity, Urine 1.003 (*)    All other components within normal limits  RAPID URINE DRUG SCREEN, HOSP PERFORMED - Abnormal; Notable for the following components:   Cocaine POSITIVE (*)    Tetrahydrocannabinol POSITIVE (*)     All other components within normal limits  CBC WITH DIFFERENTIAL/PLATELET  COMPREHENSIVE METABOLIC PANEL    EKG None  Radiology DG Shoulder Left  Result Date: 06/29/2022 CLINICAL DATA:  Left shoulder pain with possible dislocation. EXAM: LEFT SHOULDER - 2+ VIEW COMPARISON:  None Available. FINDINGS: There is no evidence of fracture or dislocation. There is no evidence of arthropathy or other focal bone abnormality. Soft tissues are unremarkable. IMPRESSION: Negative. Electronically Signed   By: Aram Candela M.D.   On: 06/29/2022 18:34    Procedures Procedures    Medications Ordered in ED Medications  ibuprofen (ADVIL) tablet 800 mg (800 mg Oral Given 06/29/22 1604)  methocarbamol (ROBAXIN) tablet 500 mg (500 mg Oral Given 06/29/22 1604)    ED Course/ Medical Decision Making/ A&P                             Medical Decision Making Patient here for complaint of low back pain after carrying a heavy mattress last evening.  Pain is positional, improves at rest.  No radicular symptoms, no red flags concerning for cauda equina.  No focal neurodeficits. He is also requesting evaluation for depression and intermittent suicidal thoughts.  Admits to having thoughts of suicide this morning but denies any SI or HI at this time.  Was on antipsychotic medications but has been off of his medicine for approximately 3 months.  Requesting to speak with counselor.  He is voluntary at this time.  Cooperative  Low back pain felt to be musculoskeletal, fracture or dislocation considered but felt less likely given absence of trauma.  No concerning symptoms on exam for cauda equina.  No recent procedure to suggest spinal abscess.  Patient initially seen in fast-track, was moved to acute side for TTS evaluation and for suicide monitoring  Amount and/or Complexity of Data Reviewed Labs: ordered.    Details: Labs interpreted by me, UDS positive for cocaine and THC otherwise unremarkable Radiology:  ordered.    Details: X-ray of the left shoulder without acute bony finding Discussion of management or test interpretation with external provider(s): The patient finished his TTS with both the TTS clinician and [facility] provider Vernard Gambles, NP). He has been cleared by psychiatric provider and recommended to seek medication management and outpatient therapy through [facility] in [facility].  Patient remains cooperative.  Agreeable to outpatient follow-up with [facility].  At time of discharge, I was informed by nursing staff that patient was complaining of left shoulder pain and states he has recurrent shoulder dislocations.  X-ray was ordered. X-ray without findings for fracture or dislocation.  Patient appropriate for discharge home at this time.  Given resource list    Risk Prescription drug management.           Final Clinical Impression(s) / ED Diagnoses Final diagnoses:  Strain of lumbar region, initial encounter  Depression, unspecified depression type    Rx / DC Orders ED Discharge Orders     None         Pauline Aus, PA-C 06/29/22 1903    Vanetta Mulders, MD 07/01/22 [phone]

## 2022-06-29 NOTE — Discharge Instructions (Signed)
Please contact [facility] to arrange a follow up appt.  You may apply ice packs on/off to your lower back

## 2022-06-29 NOTE — ED Triage Notes (Addendum)
Pt states he was carrying a large mattress last night and now has lower back pain rated 6/10. No meds PTA, no prior back problems. Pt also notes he has been depressed and wants to ask about med refills, and his tetanus shot is past due and needs to be updated.

## 2022-06-29 NOTE — BH Assessment (Signed)
@  1622, requested TTS machine to be placed in the room.

## 2022-06-29 NOTE — BH Assessment (Addendum)
Comprehensive Clinical Assessment (CCA) Note  06/29/2022 [name] [id number]  Disposition: The patient finished his TTS with both the TTS clinician and [facility] provider Vernard Gambles, NP). He has been cleared by our psychiatric provider and recommended to seek medication management and outpatient therapy through [facility] in [facility].   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 He reported  to the ED staff a history of depression and recent thoughts of self-harm without current plans or intent. Caleb Bray also requested refills for his psychiatric medications, indicating he has been off these medications for approximately 3 months. He denied any current visual or auditory hallucinations. Maggie disclosed recent [facility] use and confirmed he does not have a primary care provider or therapist.  During the tele-assessment conducted today, Caleb Bray, revealed Caleb Bray's a diagnosis of depression and past suicidal ideations. He reported having passive and fleeting suicidal thoughts earlier this morning, triggered by Father's Day and the memory of his twin daughters who passed away when he was 62 years old. Caleb Bray stated that talking to his roommate helped alleviate these thoughts, and he now reports feeling better.  Caleb Bray further reported ongoing depressive symptoms, including feelings of hopelessness, isolating himself from others, crying spells, fatigue, anger/irritability, and insomnia. He described difficulty falling asleep, averaging only 3-4 hours of sleep per night, with fair appetite and no  significant weight changes. Caleb Bray also has a history of anxiety symptoms but denied experiencing panic attacks. During the assessment, Caleb Bray denied a history of homicidal ideations, aggressive behaviors, or assaultive behaviors. He has no legal history and is not on probation or parole. He stated that he does not currently have a psychiatrist but was previously seeing Dr. Geanie Cooley at [facility]. Griffen could not recall the names of the medications he was prescribed during his previous treatment. Caleb Bray lives with a roommate and is single with no living children. His highest level of education is high school.  During the assessment, collateral information was obtained from [place] grandmother, Teresita Madura, who is his support system. She was contacted with Mekiah's verbal consent to retrieve information. She expressed no safety concerns regarding Caleb Bray being suicidal, homicidal, or experiencing auditory hallucinations. However, she shared concerns about Caleb Bray being off his psychiatric medications and emphasized the importance of him re-establishing services with a provider to restart his medications.  Aakash appeared oriented to person, place, and time. His speech was normal in rate, rhythm, and volume. His affect was appropriate to the content of the discussion, displaying appropriate emotional range. [name] exhibited good eye contact throughout the assessment. He demonstrated insight into his condition and expressed a willingness to engage in treatment recommendations.    CCA Screening, Triage and Referral (STR)  Patient Reported Information How did you hear about Caleb Bray? Self  What Is the Reason for Your Visit/Call Today? He also requesting evaluation for depression and suicidal thought. States he had thoughts of harming himself this morning but denies any suicidal homicidal thoughts or plans at this time, denies any visual or auditory hallucinations. He is requesting refills for his psychotic medications. States he  has been off of these medicines for approximately 3 months. Does not currently have  a PCP.  How Long Has This Been Causing You Problems? > than 6 months  What Do You Feel Would Help You the Most Today? Stress Management; Medication(s); Treatment for Depression or other mood problem   Have You Recently Had Any Thoughts About Hurting Yourself? Yes  Are You Planning to Commit Suicide/Harm Yourself At This time? No   Flowsheet Row ED from 06/29/2022 in [facility] Emergency Department at [facility] ED from 03/29/2022 in [facility] Emergency Department at [facility] ED from 10/12/2021 in [facility] Emergency Department at [facility]  C-SSRS RISK CATEGORY No Risk No Risk No Risk       Have you Recently Had Thoughts About Hurting Someone Caleb Bray? No  Are You Planning to Harm Someone at This Time? No  Explanation: n/a; patient denies   Have You Used Any Alcohol or Drugs in the Past 24 Hours? Yes  What Did You Use and How Much? THC; yesterday; $20 worth   Do You Currently Have a Therapist/Psychiatrist? No  Name of Therapist/Psychiatrist: Name of Therapist/Psychiatrist: Patient does not have a therapist/psychiatrist at this time.   Have You Been Recently Discharged From Any Office Practice or Programs? No  Explanation of Discharge From Practice/Program: n/a     CCA Screening Triage Referral Assessment Type of Contact: Tele-Assessment  Telemedicine Service Delivery: Telemedicine service delivery: This service was provided via telemedicine using a 2-way, interactive audio and video technology  Is this Initial or Reassessment? Is this Initial or Reassessment?: Initial Assessment  Date Telepsych consult ordered in [facility]:  Date Telepsych consult ordered in [facility]: 06/29/22  Time Telepsych consult ordered in [facility]:    Location of Assessment: [facility] Assessment Services  Provider Location: [facility] Assessment Services   Collateral Involvement: allen,Dianne  Database administrator)  [phone] (Home Phone)   Does Patient Have a Court Appointed Legal Guardian? No  Legal Guardian Contact Information: Patient does not have a legal guardian; he is his own legal guardian.  Copy of Legal Guardianship Form: No - copy requested (Patient is his own legal guardian.)  Legal Guardian Notified of Arrival: -- (Patient is his own legal guardian.)  Legal Guardian Notified of Pending Discharge: -- (Patient is his own legal guardian.)  If Minor and Not Living with Parent(s), Who has Custody? N/A  Is CPS involved or ever been involved? Never  Is APS involved or ever been involved? Never   Patient Determined To Be At Risk for Harm To Self or Others Based on Review of Patient Reported Information or Presenting Complaint? No  Method: No Plan  Availability of Means: No access or NA (Patient has no access to means. No firearms.)  Intent: Vague intent or NA  Notification Required: No need or identified person  Additional Information for Danger to Others Potential: -- (Patient denies SI. States that he did have fleeting thoughts earlier today but spoke to his roommate and then felt better. Denies SI at this time.)  Additional Comments for Danger to Others Potential: n/a  Are There Guns or Other Weapons in Your Home? No  Types of Guns/Weapons: Patient has access to guns and weapons.  Are These Weapons Safely Secured?                            -- (patient has no access to weapons.)  Who Could Verify You Are Able To Have These Secured: n/a; patient denies access to weapons  Do You Have  any Outstanding Charges, Pending Court Dates, Parole/Probation? patient denies  Contacted To Inform of Risk of Harm To Self or Others: Other: Comment (allen,Dianne (Grandmother) [phone] [facility] Phone); with patient's consent retrieved collateral information from his grandmother. She does not feel patient is a danger to self and/or others.)    Does Patient Present under  Involuntary Commitment? No    [place] of Residence: [place]   Patient Currently Receiving the Following Services: -- (Patient has no services in place at this time.)   Determination of Need: Routine (7 days)   Options For Referral: Medication Management; Outpatient Therapy; Intensive Outpatient Therapy; Partial Hospitalization     CCA Biopsychosocial Patient Reported Schizophrenia/Schizoaffective Diagnosis in Past: No   Strengths: Patient is motivated for treatment. Willing to follow up. Cooperative. Engaged.   Mental Health Symptoms Depression:   Difficulty Concentrating; Fatigue; Irritability; Sleep (too much or little)   Duration of Depressive symptoms:  Duration of Depressive Symptoms: Greater than two weeks   Mania:   None   Anxiety:    Tension; Worrying   Psychosis:   None   Duration of Psychotic symptoms:    Trauma:   Emotional numbing; Irritability/anger   Obsessions:   Good insight   Compulsions:   None   Inattention:   None   Hyperactivity/Impulsivity:   None   Oppositional/Defiant Behaviors:   None   Emotional Irregularity:   Mood lability; Intense/unstable relationships   Other Mood/Personality Symptoms:   Patietn is calm and cooperative. Polite. Mood is appropriate.    Mental Status Exam Appearance and self-care  Stature:   Average   Weight:   Average weight   Clothing:   Neat/clean   Grooming:   Normal   Cosmetic use:   None   Posture/gait:   Normal   Motor activity:   Not Remarkable   Sensorium  Attention:   Normal   Concentration:   Normal   Orientation:   Time; Situation; Place; Person   Recall/memory:   Normal   Affect and Mood  Affect:   Anxious   Mood:   Anxious   Relating  Eye contact:   Normal   Facial expression:   Depressed   Attitude toward examiner:   Cooperative   Thought and Language  Speech flow:  Clear and Coherent   Thought content:   Appropriate to Mood and  Circumstances   Preoccupation:   None   Hallucinations:   None   Organization:   Coherent   [employer] of Knowledge:   Average   Intelligence:   Average   Abstraction:   Normal   Judgement:   Normal   Reality Testing:   Adequate   Insight:   Fair   Decision Making:   Normal   Social Functioning  Social Maturity:   Isolates; Impulsive   Social Judgement:   Normal   Stress  Stressors:   Grief/losses   Coping Ability:   Normal   Skill Deficits:   Communication; Decision making   Supports:   Family     Religion: Religion/Spirituality Are You A Religious Person?: No How Might This Affect Treatment?: n/a  Leisure/Recreation: Leisure / Recreation Do You Have Hobbies?: No  Exercise/Diet: Exercise/Diet Do You Exercise?: No Have You Gained or Lost A Significant Amount of Weight in the Past Six Months?: No Do You Follow a Special Diet?: No Do You Have Any Trouble Sleeping?: Yes Explanation of Sleeping Difficulties: 3-4 hrs per night   CCA Employment/Education Employment/Work Situation:  Employment / Work Situation Employment Situation: Unemployed Patient's Job has Been Impacted by Current Illness: No Has Patient ever Been in Equities trader?: No  Education: Education Is Patient Currently Attending School?: No Last Grade Completed:  (12th grade) Did You Attend College?: No Did You Have An Individualized Education Program (IIEP): No Did You Have Any Difficulty At School?: No Patient's Education Has Been Impacted by Current Illness: No   CCA Family/Childhood History Family and Relationship History: Family history Marital status: Single Does patient have children?: No (Patient states that he had 35 month old twins that are now deceased.)  Childhood History:  Childhood History By whom was/is the patient raised?: Grandparents Did patient suffer any verbal/emotional/physical/sexual abuse as a child?: No Did patient suffer  from severe childhood neglect?: No Has patient ever been sexually abused/assaulted/raped as an adolescent or adult?: No Was the patient ever a victim of a crime or a disaster?: No Witnessed domestic violence?: No Has patient been affected by domestic violence as an adult?: No       CCA Substance Use Alcohol/Drug Use: Alcohol / Drug Use Pain Medications: SEE MAR Prescriptions: SEE MAR Over the Counter: SEE MAR History of alcohol / drug use?: Yes Substance #1 Name of Substance 1: THC 1 - Age of First Use: early teens 1 - Amount (size/oz): varies; $20 worth 1 - Frequency: daily 1 - Duration: on-gong 1 - Last Use / Amount: yesterday; 06-28-2022 1 - Method of Aquiring: varies 1- Route of Use: smoke                       ASAM's:  Six Dimensions of Multidimensional Assessment  Dimension 1:  Acute Intoxication and/or Withdrawal Potential:      Dimension 2:  Biomedical Conditions and Complications:      Dimension 3:  Emotional, Behavioral, or Cognitive Conditions and Complications:     Dimension 4:  Readiness to Change:     Dimension 5:  Relapse, Continued use, or Continued Problem Potential:     Dimension 6:  Recovery/Living Environment:     ASAM Severity Score:    ASAM Recommended Level of Treatment:     Substance use Disorder (SUD) Substance Use Disorder (SUD)  Checklist Symptoms of Substance Use: Continued use despite having a persistent/recurrent physical/psychological problem caused/exacerbated by use, Evidence of tolerance, Persistent desire or unsuccessful efforts to cut down or control use  Recommendations for Services/Supports/Treatments: Recommendations for Services/Supports/Treatments Recommendations For Services/Supports/Treatments: Medication Management, Individual Therapy, IOP (Intensive Outpatient Program)  Discharge Disposition:    DSM5 Diagnoses: There are no problems to display for this patient.    Referrals to Alternative  Service(s): Referred to Alternative Service(s):   Place:   Date:   Time:    Referred to Alternative Service(s):   Place:   Date:   Time:    Referred to Alternative Service(s):   Place:   Date:   Time:    Referred to Alternative Service(s):   Place:   Date:   Time:     Melynda Ripple, Counselor

## 2022-09-12 ENCOUNTER — Other Ambulatory Visit: Payer: Self-pay

## 2022-09-12 ENCOUNTER — Emergency Department (HOSPITAL_COMMUNITY)
Admission: EM | Admit: 2022-09-12 | Discharge: 2022-09-12 | Disposition: A | Discharge status: Home / Self Care | Payer: MEDICAID | Attending: Emergency Medicine | Admitting: Emergency Medicine

## 2022-09-12 ENCOUNTER — Encounter (HOSPITAL_COMMUNITY): Payer: Self-pay | Admitting: *Deleted

## 2022-09-12 DIAGNOSIS — H6123 Impacted cerumen, bilateral: Secondary | ICD-10-CM | POA: Insufficient documentation

## 2022-09-12 DIAGNOSIS — J45909 Unspecified asthma, uncomplicated: Secondary | ICD-10-CM | POA: Diagnosis not present

## 2022-09-12 DIAGNOSIS — H9203 Otalgia, bilateral: Secondary | ICD-10-CM | POA: Diagnosis present

## 2022-09-12 DIAGNOSIS — E119 Type 2 diabetes mellitus without complications: Secondary | ICD-10-CM | POA: Insufficient documentation

## 2022-09-12 DIAGNOSIS — F17210 Nicotine dependence, cigarettes, uncomplicated: Secondary | ICD-10-CM | POA: Insufficient documentation

## 2022-09-12 NOTE — Discharge Instructions (Addendum)
As discussed, recommend over-the-counter Peridex drops whenever you feel like you have excess earwax buildup.  Avoid use of Q-tips or other objects in your ear.  Recommend follow-up with primary care for reassessment of your symptoms.  Please do not hesitate to return to emergency department if there are worrisome signs and symptoms we discussed become apparent.

## 2022-09-12 NOTE — ED Triage Notes (Signed)
Pt with bilateral ear pain left worse than right since last week.

## 2022-09-12 NOTE — ED Provider Notes (Signed)
[place] EMERGENCY DEPARTMENT AT [facility] Provider Note   CSN: [id number] Arrival date & time: 09/12/22  1415     History  Chief Complaint  Patient presents with   Ear Pain    Caleb Bray is a 22 y.o. male.  HPI   22 year old male presents emergency department with complaints of "clogged ears."  Patient states he has a history of earwax building up in both of his ears.  Has been trying to remove earwax with Q-tips at home without success.  Denies feels like he has decreased hearing on the left side but both ears feel like they are clogged.  Denies any pain, fever.  States that he typically has his ears irrigated when he gets to this point.  Past medical history significant for asthma, diabetes mellitus, seizure  Home Medications Prior to Admission medications   Medication Sig Start Date End Date Taking? Authorizing Provider  amoxicillin (AMOXIL) 500 MG capsule Take 1 capsule (500 mg total) by mouth 3 (three) times daily. 10/12/21   Burgess Amor, PA-C  citalopram (CELEXA) 20 MG tablet Take 20 mg by mouth daily. 04/11/20   [provider]  ibuprofen (ADVIL) 800 MG tablet Take 1 tablet (800 mg total) by mouth 3 (three) times daily. 06/29/22   Triplett, Tammy, PA-C  loratadine-pseudoephedrine (CLARITIN-D 12 HOUR) 5-120 MG tablet Take 1 tablet by mouth 2 (two) times daily. 10/12/21   Burgess Amor, PA-C  methocarbamol (ROBAXIN) 500 MG tablet Take 1 tablet (500 mg total) by mouth 3 (three) times daily. 06/29/22   Triplett, Tammy, PA-C  ondansetron (ZOFRAN) 4 MG tablet Take 1 tablet (4 mg total) by mouth every 6 (six) hours. As needed for nausea vomiting 03/29/22   Triplett, Tammy, PA-C  pantoprazole (PROTONIX) 40 MG tablet Take 1 tablet (40 mg total) by mouth daily. 03/29/22   Triplett, Tammy, PA-C  risperiDONE (RISPERDAL) 1 MG tablet Take 1 mg by mouth daily. 04/11/20   [provider]  traZODone (DESYREL) 100 MG tablet Take 100 mg by mouth at bedtime. 04/11/20    [provider]      Allergies    Trazodone and nefazodone    Review of Systems   Review of Systems  All other systems reviewed and are negative.   Physical Exam Updated Vital Signs BP 122/83   Pulse 60   Temp (!) 97.5 F (36.4 C)   Resp 18   Ht 5\' 9"  (1.753 m)   Wt 89.8 kg   SpO2 98%   BMI 29.24 kg/m  Physical Exam Vitals and nursing note reviewed.  Constitutional:      General: He is not in acute distress.    Appearance: He is well-developed.  HENT:     Head: Normocephalic and atraumatic.     Ears:     Comments: Nonabsorbable TM bilaterally.  Cerumen impaction bilaterally. Eyes:     Conjunctiva/sclera: Conjunctivae normal.  Cardiovascular:     Rate and Rhythm: Normal rate and regular rhythm.     Heart sounds: No murmur heard. Pulmonary:     Effort: Pulmonary effort is normal. No respiratory distress.     Breath sounds: Normal breath sounds.  Abdominal:     Palpations: Abdomen is soft.     Tenderness: There is no abdominal tenderness.  Musculoskeletal:        General: No swelling.     Cervical back: Neck supple.  Skin:    General: Skin is warm and dry.  Capillary Refill: Capillary refill takes less than 2 seconds.  Neurological:     Mental Status: He is alert.  Psychiatric:        Mood and Affect: Mood normal.     ED Results / Procedures / Treatments   Labs (all labs ordered are listed, but only abnormal results are displayed) Labs Reviewed - No data to display  EKG None  Radiology No results found.  Procedures Procedures    Medications Ordered in ED Medications - No data to display  ED Course/ Medical Decision Making/ A&P                                 Medical Decision Making  This patient presents to the ED for concern of "clogged ears.", this involves an extensive number of treatment options, and is a complaint that carries with it a high risk of complications and morbidity.  The differential diagnosis includes cerumen  impaction, otitis externa, otitis media, cellulitis, or Supples   Co morbidities that complicate the patient evaluation  See HPI   Additional history obtained:  Additional history obtained from EMR External records from outside source obtained and reviewed including hospital records   Lab Tests:  N/a   Imaging Studies ordered:  N/a   Cardiac Monitoring: / EKG:  The patient was maintained on a cardiac monitor.  I personally viewed and interpreted the cardiac monitored which showed an underlying rhythm of: Sinus rhythm   Consultations Obtained:  N/a   Problem List / ED Course / Critical interventions / Medication management  Cerumen impaction Reevaluation of the patient showed that the patient stayed the same I have reviewed the patients home medicines and have made adjustments as needed   Social Determinants of Health:  Chronic cigarette use.  Denies illicit drug use.   Test / Admission - Considered:  Cerumen impaction Vitals signs within normal range and stable throughout visit. 22 year old male presents emergency department with complaints of bilateral ear fullness.  On exam, patient with evidence of bilateral cerumen impaction.  Earwax removed by nursing staff via irrigation with subsequent improvement of symptoms. TM intact bilaterally.  No evidence of otitis media/otitis externa.  Will recommend cessation of placing foreign objects in ears as well as using Debrox as needed for increased earwax buildup.  Treatment plan discussed at length with patient and he acknowledged understanding was agreeable to said plan.  Patient over well-appearing, afebrile in no acute distress. Worrisome signs and symptoms were discussed with the patient, and the patient acknowledged understanding to return to the ED if noticed. Patient was stable upon discharge.          Final Clinical Impression(s) / ED Diagnoses Final diagnoses:  Bilateral impacted cerumen    Rx / DC  Orders ED Discharge Orders     None         Peter Garter, [place] 09/12/22 1631    Vanetta Mulders, MD 09/12/22 2329

## 2022-09-12 NOTE — ED Notes (Signed)
Introduced self to pt Pt stated that he has ear wax build up in both ears  Pt stated that this is an ongoing thing and usually uses peroxide at home to flush ears Pt stated that he is out of peroxide and feels like both ears are full  Attached to partial monitor Waiting for MD eval and further orders.

## 2022-09-12 NOTE — ED Notes (Signed)
Both ears flushed Large amount of wax balls removed from both ears

## 2022-10-15 ENCOUNTER — Telehealth: Payer: Self-pay

## 2022-10-15 NOTE — Telephone Encounter (Signed)
Patient answer the phone, introduce myself offer help to establish care at [facility], patient agreed as I was going thru the process call dropped, I called patient back but there was no answer.

## 2022-12-20 ENCOUNTER — Encounter (HOSPITAL_COMMUNITY): Payer: Self-pay | Admitting: *Deleted

## 2022-12-20 ENCOUNTER — Other Ambulatory Visit: Payer: Self-pay

## 2022-12-20 ENCOUNTER — Emergency Department (HOSPITAL_COMMUNITY)
Admission: EM | Admit: 2022-12-20 | Discharge: 2022-12-20 | Disposition: A | Discharge status: Home / Self Care | Payer: MEDICAID | Attending: Emergency Medicine | Admitting: Emergency Medicine

## 2022-12-20 DIAGNOSIS — H6122 Impacted cerumen, left ear: Secondary | ICD-10-CM | POA: Diagnosis not present

## 2022-12-20 DIAGNOSIS — H9202 Otalgia, left ear: Secondary | ICD-10-CM | POA: Diagnosis present

## 2022-12-20 DIAGNOSIS — H9209 Otalgia, unspecified ear: Secondary | ICD-10-CM

## 2022-12-20 NOTE — ED Notes (Signed)
Pt stated both ears are clogged This RN familiar with pt and flushed ears in the past Pt stated he has not been able to afford drops and did not pick any up.

## 2022-12-20 NOTE — ED Provider Notes (Signed)
[place] EMERGENCY DEPARTMENT AT [facility] Provider Note   CSN: [id number] Arrival date & time: 12/20/22  1801     History  Chief Complaint  Patient presents with   Hearing Problem    Caleb Bray is a 22 y.o. male.  Patient to ED with left ear discomfort and muffled hearing for the past week. No drainage. No fever, sore throat, congestion.   The history is provided by the patient. No language interpreter was used.       Home Medications Prior to Admission medications   Medication Sig Start Date End Date Taking? Authorizing Provider  amoxicillin (AMOXIL) 500 MG capsule Take 1 capsule (500 mg total) by mouth 3 (three) times daily. 10/12/21   Burgess Amor, PA-C  citalopram (CELEXA) 20 MG tablet Take 20 mg by mouth daily. 04/11/20   [provider]  ibuprofen (ADVIL) 800 MG tablet Take 1 tablet (800 mg total) by mouth 3 (three) times daily. 06/29/22   Triplett, Tammy, PA-C  loratadine-pseudoephedrine (CLARITIN-D 12 HOUR) 5-120 MG tablet Take 1 tablet by mouth 2 (two) times daily. 10/12/21   Burgess Amor, PA-C  methocarbamol (ROBAXIN) 500 MG tablet Take 1 tablet (500 mg total) by mouth 3 (three) times daily. 06/29/22   Triplett, Tammy, PA-C  ondansetron (ZOFRAN) 4 MG tablet Take 1 tablet (4 mg total) by mouth every 6 (six) hours. As needed for nausea vomiting 03/29/22   Triplett, Tammy, PA-C  pantoprazole (PROTONIX) 40 MG tablet Take 1 tablet (40 mg total) by mouth daily. 03/29/22   Triplett, Tammy, PA-C  risperiDONE (RISPERDAL) 1 MG tablet Take 1 mg by mouth daily. 04/11/20   [provider]  traZODone (DESYREL) 100 MG tablet Take 100 mg by mouth at bedtime. 04/11/20   [provider]      Allergies    Trazodone and nefazodone    Review of Systems   Review of Systems  Physical Exam Updated Vital Signs BP 118/81 (BP Location: Right Arm)   Pulse 65   Temp 98.2 F (36.8 C) (Oral)   Resp 20   Ht 5\' 9"  (1.753 m)   Wt 77.1 kg   SpO2 100%    BMI 25.10 kg/m  Physical Exam Vitals and nursing note reviewed.  Constitutional:      Appearance: Normal appearance.  HENT:     Right Ear: Tympanic membrane normal.     Left Ear: There is impacted cerumen.     Nose: Nose normal.     Mouth/Throat:     Mouth: Mucous membranes are moist.  Cardiovascular:     Rate and Rhythm: Normal rate.  Pulmonary:     Effort: Pulmonary effort is normal.  Musculoskeletal:        General: Normal range of motion.  Neurological:     Mental Status: He is alert.     ED Results / Procedures / Treatments   Labs (all labs ordered are listed, but only abnormal results are displayed) Labs Reviewed - No data to display  EKG None  Radiology No results found.  Procedures Procedures    Medications Ordered in ED Medications - No data to display  ED Course/ Medical Decision Making/ A&P Clinical Course as of 12/20/22 2035  [facility] Dec 20, 2022  1324 Patient has multiple visits for cerumen impaction. Has been recommended home care to prevent as well as ear drops to help impaction from forming. He reports he cannot afford the medications. No fever. He has a recurrent impaction  tonight. No emergency condition if felt present. He is able to be discharged without lavage and follow up with PCP as is appropriate.  [SU]    Clinical Course User Index [SU] Elpidio Anis, PA-C                                 Medical Decision Making          Final Clinical Impression(s) / ED Diagnoses Final diagnoses:  Ear ache    Rx / DC Orders ED Discharge Orders     None         Danne [place] 12/20/22 2035    Gloris Manchester, MD 12/20/22 2321

## 2022-12-20 NOTE — Discharge Instructions (Addendum)
Recommend saline nasal spray to relieve nasal congestion which is what is likely causing your ear pain. There is no cerumen impaction (wax build up) today.   Please consider getting a primary care provider for nonurgent medical issues.

## 2022-12-20 NOTE — ED Triage Notes (Signed)
Pt with left ear pain while laying on that side, also with mild hearing loss x 1 week, sounds are dull per pt.
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
